# Patient Record
Sex: Female | Born: 1960 | Race: Black or African American | Hispanic: No | Marital: Single | Smoking: Never smoker
Health system: Southern US, Community
[De-identification: ages and names within clinical notes are randomized; demographics above are authoritative.]

## PROBLEM LIST (undated history)

## (undated) ENCOUNTER — Ambulatory Visit: Source: Home / Self Care

---

## 1999-03-17 ENCOUNTER — Other Ambulatory Visit: Admission: RE | Admit: 1999-03-17 | Discharge: 1999-03-17 | Payer: Self-pay | Admitting: *Deleted

## 2000-05-18 ENCOUNTER — Other Ambulatory Visit: Admission: RE | Admit: 2000-05-18 | Discharge: 2000-05-18 | Payer: Self-pay | Admitting: Obstetrics and Gynecology

## 2001-07-08 ENCOUNTER — Other Ambulatory Visit: Admission: RE | Admit: 2001-07-08 | Discharge: 2001-07-08 | Payer: Self-pay | Admitting: Obstetrics and Gynecology

## 2001-07-19 ENCOUNTER — Ambulatory Visit (HOSPITAL_COMMUNITY): Admission: RE | Admit: 2001-07-19 | Discharge: 2001-07-19 | Payer: Self-pay | Admitting: Obstetrics and Gynecology

## 2001-07-19 ENCOUNTER — Encounter: Payer: Self-pay | Admitting: Obstetrics and Gynecology

## 2002-05-29 ENCOUNTER — Other Ambulatory Visit: Admission: RE | Admit: 2002-05-29 | Discharge: 2002-05-29 | Payer: Self-pay | Admitting: Obstetrics and Gynecology

## 2003-08-10 ENCOUNTER — Ambulatory Visit (HOSPITAL_COMMUNITY): Admission: RE | Admit: 2003-08-10 | Discharge: 2003-08-10 | Payer: Self-pay | Admitting: Obstetrics and Gynecology

## 2003-08-19 ENCOUNTER — Other Ambulatory Visit: Admission: RE | Admit: 2003-08-19 | Discharge: 2003-08-19 | Payer: Self-pay | Admitting: Obstetrics and Gynecology

## 2004-05-09 ENCOUNTER — Other Ambulatory Visit: Admission: RE | Admit: 2004-05-09 | Discharge: 2004-05-09 | Payer: Self-pay | Admitting: Obstetrics and Gynecology

## 2005-02-13 ENCOUNTER — Ambulatory Visit: Payer: Self-pay | Admitting: Obstetrics and Gynecology

## 2005-09-04 ENCOUNTER — Other Ambulatory Visit: Admission: RE | Admit: 2005-09-04 | Discharge: 2005-09-04 | Payer: Self-pay | Admitting: Obstetrics and Gynecology

## 2006-08-21 ENCOUNTER — Emergency Department (HOSPITAL_COMMUNITY): Admission: EM | Admit: 2006-08-21 | Discharge: 2006-08-21 | Payer: Self-pay | Admitting: Emergency Medicine

## 2018-01-07 ENCOUNTER — Other Ambulatory Visit: Payer: Self-pay | Admitting: Obstetrics and Gynecology

## 2018-01-07 ENCOUNTER — Other Ambulatory Visit (HOSPITAL_COMMUNITY)
Admission: RE | Admit: 2018-01-07 | Discharge: 2018-01-07 | Disposition: A | Discharge status: Home / Self Care | Payer: BLUE CROSS/BLUE SHIELD | Source: Ambulatory Visit | Attending: Obstetrics and Gynecology | Admitting: Obstetrics and Gynecology

## 2018-01-07 DIAGNOSIS — Z01411 Encounter for gynecological examination (general) (routine) with abnormal findings: Secondary | ICD-10-CM | POA: Diagnosis present

## 2018-01-08 LAB — CYTOLOGY - PAP
Diagnosis: NEGATIVE
HPV: NOT DETECTED

## 2018-01-09 ENCOUNTER — Other Ambulatory Visit: Payer: Self-pay | Admitting: Internal Medicine

## 2018-01-09 DIAGNOSIS — E2839 Other primary ovarian failure: Secondary | ICD-10-CM

## 2018-01-09 DIAGNOSIS — Z1231 Encounter for screening mammogram for malignant neoplasm of breast: Secondary | ICD-10-CM

## 2018-03-25 ENCOUNTER — Ambulatory Visit
Admission: RE | Admit: 2018-03-25 | Discharge: 2018-03-25 | Disposition: A | Discharge status: Home / Self Care | Payer: BLUE CROSS/BLUE SHIELD | Source: Ambulatory Visit | Attending: Internal Medicine | Admitting: Internal Medicine

## 2018-03-25 DIAGNOSIS — E2839 Other primary ovarian failure: Secondary | ICD-10-CM

## 2018-03-25 DIAGNOSIS — Z1231 Encounter for screening mammogram for malignant neoplasm of breast: Secondary | ICD-10-CM

## 2019-06-23 ENCOUNTER — Ambulatory Visit: Payer: BC Managed Care – PPO | Attending: Family

## 2019-06-23 DIAGNOSIS — Z23 Encounter for immunization: Secondary | ICD-10-CM | POA: Insufficient documentation

## 2019-06-23 NOTE — Progress Notes (Addendum)
   Covid-19 Vaccination Clinic  Name:  Alexandra Alvarez    MRN: [id number] DOB: 1960-05-27  06/23/2019  [name] was observed post Covid-19 immunization for 30 minutes based on pre-vaccination screening without incidence. She was provided with Vaccine Information Sheet and instruction to access the V-Safe system.   [name] was instructed to call 911 with any severe reactions post vaccine: Marland Kitchen Difficulty breathing  . Swelling of your face and throat  . A fast heartbeat  . A bad rash all over your body  . Dizziness and weakness    Immunizations Administered    Name Date Dose VIS Date Route   Moderna COVID-19 Vaccine 06/23/2019  1:14 PM 0.5 mL 04/01/2019 Intramuscular   Manufacturer: Gala Murdoch   Lot: [id number]   NDC: 39179-217-83    Patient noted to have hives on bilateral cheeks non itchy, no respiratory distress or other side effects. bp 135/89 pulse 81 sats 97%, 25mg  of benadryl and 10mg  of zyrtec given. Patient observed for additional 15 minutes.

## 2019-07-22 ENCOUNTER — Ambulatory Visit: Payer: BC Managed Care – PPO | Attending: Family

## 2019-07-22 DIAGNOSIS — Z23 Encounter for immunization: Secondary | ICD-10-CM

## 2019-07-22 NOTE — Progress Notes (Signed)
   Covid-19 Vaccination Clinic  Name:  Alexandra Alvarez    MRN: [id number] DOB: 1960-05-05  07/22/2019  [name] was observed post Covid-19 immunization for 30 minutes based on pre-vaccination screening without incident. She was provided with Vaccine Information Sheet and instruction to access the V-Safe system.   [name] was instructed to call 911 with any severe reactions post vaccine: Marland Kitchen Difficulty breathing  . Swelling of face and throat  . A fast heartbeat  . A bad rash all over body  . Dizziness and weakness   Immunizations Administered    Name Date Dose VIS Date Route   Moderna COVID-19 Vaccine 07/22/2019  9:39 AM 0.5 mL 04/01/2019 Intramuscular   Manufacturer: Moderna   Lot: [id number]   NDC: 58307-460-02

## 2019-08-05 ENCOUNTER — Ambulatory Visit: Payer: BC Managed Care – PPO

## 2020-04-16 ENCOUNTER — Other Ambulatory Visit: Payer: Self-pay | Admitting: Internal Medicine

## 2020-04-16 DIAGNOSIS — Z1231 Encounter for screening mammogram for malignant neoplasm of breast: Secondary | ICD-10-CM

## 2020-05-28 ENCOUNTER — Ambulatory Visit: Payer: BC Managed Care – PPO

## 2020-07-08 ENCOUNTER — Inpatient Hospital Stay: Admission: RE | Admit: 2020-07-08 | Payer: Self-pay | Source: Ambulatory Visit

## 2021-02-17 ENCOUNTER — Other Ambulatory Visit: Payer: Self-pay | Admitting: Internal Medicine

## 2021-02-17 DIAGNOSIS — Z1231 Encounter for screening mammogram for malignant neoplasm of breast: Secondary | ICD-10-CM

## 2021-02-22 ENCOUNTER — Other Ambulatory Visit: Payer: Self-pay | Admitting: Internal Medicine

## 2021-02-22 DIAGNOSIS — M8588 Other specified disorders of bone density and structure, other site: Secondary | ICD-10-CM

## 2021-04-01 ENCOUNTER — Ambulatory Visit
Admission: RE | Admit: 2021-04-01 | Discharge: 2021-04-01 | Disposition: A | Discharge status: Home / Self Care | Payer: Commercial Managed Care - PPO | Source: Ambulatory Visit | Attending: Internal Medicine | Admitting: Internal Medicine

## 2021-04-01 DIAGNOSIS — Z1231 Encounter for screening mammogram for malignant neoplasm of breast: Secondary | ICD-10-CM

## 2021-04-20 ENCOUNTER — Ambulatory Visit: Payer: Self-pay

## 2021-05-31 ENCOUNTER — Ambulatory Visit
Admission: RE | Admit: 2021-05-31 | Discharge: 2021-05-31 | Disposition: A | Discharge status: Home / Self Care | Payer: BC Managed Care – PPO | Source: Ambulatory Visit | Attending: Internal Medicine | Admitting: Internal Medicine

## 2021-05-31 ENCOUNTER — Other Ambulatory Visit: Payer: Self-pay | Admitting: Internal Medicine

## 2021-05-31 DIAGNOSIS — R059 Cough, unspecified: Secondary | ICD-10-CM | POA: Diagnosis not present

## 2021-05-31 DIAGNOSIS — J45991 Cough variant asthma: Secondary | ICD-10-CM | POA: Diagnosis not present

## 2021-05-31 DIAGNOSIS — R053 Chronic cough: Secondary | ICD-10-CM | POA: Diagnosis not present

## 2021-06-02 ENCOUNTER — Other Ambulatory Visit: Payer: Self-pay | Admitting: Internal Medicine

## 2021-06-02 DIAGNOSIS — R053 Chronic cough: Secondary | ICD-10-CM

## 2021-06-03 ENCOUNTER — Ambulatory Visit
Admission: RE | Admit: 2021-06-03 | Discharge: 2021-06-03 | Disposition: A | Discharge status: Home / Self Care | Payer: BC Managed Care – PPO | Source: Ambulatory Visit | Attending: Internal Medicine | Admitting: Internal Medicine

## 2021-06-03 DIAGNOSIS — I700 Atherosclerosis of aorta: Secondary | ICD-10-CM | POA: Diagnosis not present

## 2021-06-03 DIAGNOSIS — R053 Chronic cough: Secondary | ICD-10-CM

## 2021-06-10 ENCOUNTER — Encounter: Payer: Self-pay | Admitting: Student

## 2021-07-18 DIAGNOSIS — Z23 Encounter for immunization: Secondary | ICD-10-CM | POA: Diagnosis not present

## 2021-07-21 NOTE — Progress Notes (Signed)
? ?Synopsis: Referred for cough by Leeroy Cha,* ? ?Subjective:  ? ?PATIENT ID: Alexandra Alvarez GENDER: female DOB: 12-23-1960, MRN: [id number] ? ?Chief Complaint  ?Patient presents with  ? Pulmonary Consult  ?  Referred by Dr. Fatima Blank for eval of persistent cough.  Pt c/o cough off and on for approx 7 months. Her cough is non prod. She has some PND. She has noticed that gluten containing foods cause her to cough and since cutting these out this has helped.   ? ?60yF with history of covid-19 infection last year - 2021. Very high fever but was not hospitalized. She has been vaccinated/boosted for covid.  ? ?She thinks her cough is caused by allergy. She has had a cough on and off over the last 6-7 months. She has tried to cut out certain foods from her diet. Wonders if gluten free diet has improved both dyspepsia and cough. Wonders if she is allergic to yeast. Cough is typically dry. Does have postnasal drip. It's inconsistent though. If persistent then she'll take claritin which will knock it out. She has no overt reflux/heartburn. No history of asthma as a kid. No accompanying throat tightness/hoarseness. Every once in a while she'll feel a spasm somewhere between chest and throat. No trouble with choking on food/water. ? ?Heading to allergy later today. ? ? ?Otherwise pertinent review of systems is negative. ? ?No family history of lung disease ? ?Never smoker, no vaping, MJ. 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 Has HEPA filters in the house. Has lived in [place] in past.  ? ?No past medical history on file.  ? ?Family History  ?Problem Relation Age of Onset  ? Heart disease Mother   ? Multiple myeloma Father   ? Heart disease Father   ? Breast cancer Neg Hx   ?  ? ?No past surgical history on file. ? ?Social History  ? ?Socioeconomic History  ? Marital status: Single  ?  Spouse name: Not on file  ? Number of children: Not on file  ? Years of education: Not on file  ? Highest  education level: Not on file  ?Occupational History  ? Not on file  ?Tobacco Use  ? Smoking status: Never  ? Smokeless tobacco: Never  ?Vaping Use  ? Vaping Use: Never used  ?Substance and Sexual Activity  ? Alcohol use: Not on file  ? Drug use: Not on file  ? Sexual activity: Not on file  ?Other Topics Concern  ? Not on file  ?Social History Narrative  ? Not on file  ? ?Social Determinants of Health  ? ?Financial Resource Strain: Not on file  ?Food Insecurity: Not on file  ?Transportation Needs: Not on file  ?Physical Activity: Not on file  ?Stress: Not on file  ?Social Connections: Not on file  ?Intimate Partner Violence: Not on file  ?  ? ?Allergies  ?Allergen Reactions  ? Peanut-Containing Drug Products Itching  ? Shellfish Allergy Hives  ? Sulfa Antibiotics Hives  ? Tylenol [Acetaminophen] Hives  ? Penicillins Rash  ?  ? ?No outpatient medications prior to visit.  ? ?No facility-administered medications prior to visit.  ? ? ? ? ? ?Objective:  ? ?Physical Exam: ? ?General appearance: 61 y.o., female, NAD, conversant  ?Eyes: anicteric sclerae; PERRL, tracking appropriately ?HENT: NCAT; MMM ?Neck: Trachea midline; no lymphadenopathy, no JVD ?Lungs: CTAB, no crackles, no wheeze, with normal respiratory effort ?CV: RRR, no murmur  ?Abdomen: Soft, non-tender; non-distended, BS  present  ?Extremities: No peripheral edema, warm ?Skin: Normal turgor and texture; no rash ?Psych: Appropriate affect ?Neuro: Alert and oriented to person and place, no focal deficit  ? ? ? ?Vitals:  ? 07/22/21 0943  ?BP: 112/70  ?Pulse: 75  ?Temp: 98.3 ?F (36.8 ?C)  ?TempSrc: Oral  ?SpO2: 99%  ?Weight: 160 lb 6.4 oz (72.8 kg)  ?Height: 5' 3.5" (1.613 m)  ? ?99% on RA ?BMI Readings from Last 3 Encounters:  ?07/22/21 27.97 kg/m?  ? ?Wt Readings from Last 3 Encounters:  ?07/22/21 160 lb 6.4 oz (72.8 kg)  ? ? ? ?CBC ?No results found for: WBC, RBC, HGB, HCT, PLT, MCV, MCH, MCHC, RDW, LYMPHSABS, MONOABS, EOSABS, BASOSABS ? ? ? ?Chest Imaging: ?CT  Chest 06/03/21 reviewed by me unremarkable ? ?Pulmonary Functions Testing Results: ?   ? View : No data to display.  ?  ?  ?  ? ? ?   ?Assessment & Plan:  ? ?# Chronic cough ?Only typical modifiable trigger that we're able to identify from history seems to be postnasal drainage but it's relatively mild. GERD a possibility but she's not overtly symptomatic with heartburn at all. Lingering small airways disease from either asthma or covid possible but her initial course of covid did not lead to hospitalization. She's had some symptomatic improvement with dietary changes guided by her research. ? ?Plan: ?- PFT in 4-6 weeks on same day as follow up appointment ?- trial of daily flonase for post-nasal drainage ?- she'll follow with allergy later today ? ? ?I spent 47 minutes dedicated to the care of this patient on the date of this encounter to include pre-visit review of records, face-to-face time with the patient discussing conditions above, post visit ordering of testing, clinical documentation with the electronic health record, and communicating necessary findings to members of the patients care team.  ? ? ?Maryjane Hurter, MD ?[facility] ?07/22/2021 10:07 AM   ?

## 2021-07-22 ENCOUNTER — Other Ambulatory Visit: Payer: Self-pay

## 2021-07-22 ENCOUNTER — Encounter: Payer: Self-pay | Admitting: Allergy

## 2021-07-22 ENCOUNTER — Encounter: Payer: Self-pay | Admitting: Student

## 2021-07-22 ENCOUNTER — Ambulatory Visit: Payer: BC Managed Care – PPO | Admitting: Allergy

## 2021-07-22 ENCOUNTER — Ambulatory Visit: Payer: BC Managed Care – PPO | Admitting: Student

## 2021-07-22 VITALS — BP 112/70 | HR 75 | Temp 98.3°F | Ht 63.5 in | Wt 160.4 lb

## 2021-07-22 VITALS — BP 118/70 | HR 90 | Temp 98.1°F | Resp 20 | Ht 63.0 in | Wt 162.0 lb

## 2021-07-22 DIAGNOSIS — H1013 Acute atopic conjunctivitis, bilateral: Secondary | ICD-10-CM | POA: Diagnosis not present

## 2021-07-22 DIAGNOSIS — R053 Chronic cough: Secondary | ICD-10-CM | POA: Diagnosis not present

## 2021-07-22 DIAGNOSIS — H109 Unspecified conjunctivitis: Secondary | ICD-10-CM

## 2021-07-22 DIAGNOSIS — T781XXD Other adverse food reactions, not elsewhere classified, subsequent encounter: Secondary | ICD-10-CM | POA: Diagnosis not present

## 2021-07-22 DIAGNOSIS — T781XXA Other adverse food reactions, not elsewhere classified, initial encounter: Secondary | ICD-10-CM | POA: Diagnosis not present

## 2021-07-22 DIAGNOSIS — J310 Chronic rhinitis: Secondary | ICD-10-CM | POA: Diagnosis not present

## 2021-07-22 DIAGNOSIS — T50905D Adverse effect of unspecified drugs, medicaments and biological substances, subsequent encounter: Secondary | ICD-10-CM

## 2021-07-22 MED ORDER — FLUTICASONE PROPIONATE 50 MCG/ACT NA SUSP: 1.0000 | Freq: Every day | NASAL | 2 refills | Status: AC

## 2021-07-22 NOTE — Patient Instructions (Addendum)
-   you will be called to set up breathing tests (PFTs) ?- see you 4-6 weeks to discuss PFTs ? ?

## 2021-07-22 NOTE — Progress Notes (Signed)
? ? ?New Patient Note ? ?RE: Alexandra Alvarez MRN: [id number] DOB: April 18, 1961 ?Date of Office Visit: 07/22/2021 ? ?Primary care provider: Leeroy Cha, MD ? ?Chief Complaint: Allergies ? ?History of present illness: ?Alexandra Alvarez is a 61 y.o. female presenting today for evaluation of multiple allergies. ? ?She states she is allergic to many things.  She has never had allergy testing.  ?She states gluten is questionable for her and she states when she eats gluten products she has an "irritated gut"  with bloating, pain and feels inflamed.  ?With shellfish ingestion she develops a fine, itchy rash.  She recalls eating crab at restaurant and developed scratchy throat and fine rash on face.  ?She states peanut causes the same issue as shellfish.  ?With dairy/lactose she develops bloating and gassiness.  She pretty much avoids dairy products.  She has not tried lactose-free products. ?MSG causes her to have a dry throat and feel itchy. ?With tylenol she states she had a reaction initially with theraflu and again with plain tylenol.  She then realized that TheraFlu contains acetaminophen.  She developed hives with use of acetaminophen treated with benadryl.  ?Sulfa antibiotic she has developed a rash.  ?She has had penicillin allergy from childhood and states around age 11 she calls developing a rash over the use of a penicillin-based antibiotic.   ? ?With pollen exposure she reports having coughing.  She reports a little bit of water eyes as well.  She also does report a post-nasal drip and throat clearing. She will have more itchy/watery eyes mostly with grass pollen exposure.  Claritin usually will help these symptoms.  ? ?She is seeing pulmonology for chronic cough.  She has been recommended to have PFTs in about a month and recommended to do a trial of Flonase. ? ? ?Review of systems: ?Review of Systems  ?Constitutional: Negative.   ?HENT:  Positive for postnasal drip.   ?Eyes: Negative.   ?Respiratory:   Positive for cough.   ?Cardiovascular: Negative.   ?Gastrointestinal: Negative.   ?Musculoskeletal: Negative.   ?Skin: Negative.   ?Allergic/Immunologic: Negative.   ?Neurological: Negative.   ? ?All other systems negative unless noted above in HPI ? ?Past medical history: ?History reviewed. No pertinent past medical history. ? ? ?Past surgical history: ?History reviewed. No pertinent surgical history. ? ?Family history:  ?Family History  ?Problem Relation Age of Onset  ? Heart disease Mother   ? Multiple myeloma Father   ? Heart disease Father   ? Breast cancer Neg Hx   ? ? ?Social history: ?She lives in a home with carpeting with heat pump heating and cooling.  3 dogs in the home.  There is no concern for water damage, mildew or roaches in the home.  She has a Media planner.  She does not report a smoking history. ? ? ?Medication List: ?Current Outpatient Medications  ?Medication Sig Dispense Refill  ? BIOTIN PO     ? calcium carbonate (TUMS - DOSED IN MG ELEMENTAL CALCIUM) 500 MG chewable tablet Chew by mouth as needed.    ? fluticasone (FLONASE) 50 MCG/ACT nasal spray Place 1 spray into both nostrils daily. 16 g 2  ? Multiple Vitamin (MULTIVITAMIN PO)     ? ?No current facility-administered medications for this visit.  ? ? ?Known medication allergies: ?Allergies  ?Allergen Reactions  ? Gluten Meal   ?  Other reaction(s): stomach upset  ? Monosodium Glutamate   ?  Other reaction(s): itching  ?  Peanut-Containing Drug Products Itching  ? Shellfish Allergy Hives  ? Sulfa Antibiotics Hives  ? Tylenol [Acetaminophen] Hives  ? Penicillins Rash  ? ? ? ?Physical examination: ?Blood pressure 118/70, pulse 90, temperature 98.1 ?F (36.7 ?C), temperature source Temporal, resp. rate 20, height $RemoveBe'5\' 3"'KIoqJYCox$  (1.6 m), weight 162 lb (73.5 kg), SpO2 98 %. ? ?General: Alert, interactive, in no acute distress. ?HEENT: PERRLA, TMs pearly gray, turbinates non-edematous without discharge, post-pharynx non  erythematous. ?Neck: Supple without lymphadenopathy. ?Lungs: Clear to auscultation without wheezing, rhonchi or rales. {no increased work of breathing. ?CV: Normal S1, S2 without murmurs. ?Abdomen: Nondistended, nontender. ?Skin: Warm and dry, without lesions or rashes. ?Extremities:  No clubbing, cyanosis or edema. ?Neuro:   Grossly intact. ? ?Diagnositics/Labs: ? ?Allergy testing:  ? Airborne Adult Perc - 07/22/21 1421   ? ? Time Antigen Placed 1426   ? Allergen Manufacturer Lavella Hammock   ? Location Back   ? Number of Test 59   ? 1. Control-Buffer 50% Glycerol Negative   ? 2. Control-Histamine 1 mg/ml 2+   ? 3. Albumin saline Negative   ? 4. Sleep[place] Negative   ? 5. [place] 4+   ? 6. Johnson Negative   ? 7. Buckingham Blue Negative   ? 8. Meadow Fescue Negative   ? 9. Perennial Rye Negative   ? 10. Sweet Vernal 4+   ? 11. Timothy 2+   ? 12. Cocklebur 2+   ? 13. Burweed Marshelder Negative   ? 14. Ragweed, short Negative   ? 15. Ragweed, Giant 2+   ? 16. Plantain,  English 2+   ? 17. Lamb's Quarters Negative   ? 18. Sheep Sorrell Negative   ? 19. Rough Pigweed Negative   ? 20. Marsh Elder, Rough Negative   ? 21. Mugwort, Common Negative   ? 22. Ash mix 2+   ? 23. Wendee Copp mix Negative   ? 24. Beech American Negative   ? 25. Box, Elder Negative   ? 26. Cedar, red Negative   ? 27. Cottonwood, [place] Negative   ? 28. Elm mix Negative   ? 29. Hickory Negative   ? 30. Maple mix Negative   ? 31. Oak, [place] mix Negative   ? 32. Pecan Pollen Negative   ? 33. Pine mix Negative   ? 34. Sycamore Eastern Negative   ? 35. [place], Black Pollen 2+   ? 36. Alternaria alternata Negative   ? 75. Cladosporium Herbarum Negative   ? 38. Aspergillus mix Negative   ? 39. Penicillium mix Negative   ? 40. Bipolaris sorokiniana (Helminthosporium) Negative   ? 41. Drechslera spicifera (Curvularia) Negative   ? 42. Mucor plumbeus Negative   ? 43. Fusarium moniliforme Negative   ? 44. Aureobasidium pullulans (pullulara) Negative   ? 45. Rhizopus oryzae  Negative   ? 46. Botrytis cinera Negative   ? 47. Epicoccum nigrum Negative   ? 48. Phoma betae Negative   ? 49. Candida Albicans Negative   ? 50. Trichophyton mentagrophytes Negative   ? 51. Mite, D Farinae  5,000 AU/ml Negative   ? 52. Mite, D Pteronyssinus  5,000 AU/ml Negative   ? 53. Cat Hair 10,000 BAU/ml Negative   ? 54.  Dog Epithelia Negative   ? 55. Mixed Feathers Negative   ? 56. Horse Epithelia Negative   ? 57. Cockroach, [place] Negative   ? 58. Mouse Negative   ? 59. Tobacco Leaf Negative   ? ?  ?  ? ?  ? ?  Food Adult Perc - 07/22/21 1400   ? ? Time Antigen Placed 1426   ? Allergen Manufacturer Lavella Hammock   ? Location Back   ? Number of allergen test 19   ? 1. Peanut Negative   ? 3. Wheat Negative   ? 5. Milk, cow Negative   ? 10. Cashew Negative   ? 11. Pecan Food Negative   ? 12. Waverly Negative   ? 13. Almond Negative   ? 14. Hazelnut Negative   ? 15. [place] nut Negative   ? 16. Coconut Negative   ? 17. Pistachio Negative   ? 25. Shrimp Negative   ? 26. Crab Negative   ? 27. Lobster Negative   ? 28. Oyster Negative   ? 29. Scallops Negative   ? 30. Barley Negative   ? 31. Oat  Negative   ? 32. Rye  Negative   ? ?  ?  ? ?  ?  ?Allergy testing results were read and interpreted by provider, documented by clinical staff. ? ? ?Assessment and plan: ?Adverse food reaction ?Adverse effect of medication ?Rhinoconjunctivitis ? ?- environmental allergen testing is positive to grasses, weeds, tree pollen ?- Copy of test results provided.  ?- Avoidance measures provided. ?- Can take for allergy symptoms: Allegra (fexofenadine) 180mg  tablet OR Zyrtec (cetirizine) 10mg  tablet OR Xyzal (levocetirizine) 5mg  tablet once daily as needed.  These are long-acting antihistamines.   ?Ryaltris (olopatadine/mometasone) two sprays per nostril 1-2 times daily as needed for nasal congestion or drainage.  Sample provided ?Pataday one drop per eye twice daily as needed for itching or watering ? ?- food allergy testing is negative  to dairy, nuts, shellfish, gluten products.  ?- serum IgE levels have been ordered to ensure you do not have IgE to these foods ?- should significant symptoms recur or new symptoms occur, a journal is to be kept recording any f

## 2021-07-22 NOTE — Patient Instructions (Signed)
Adverse events to foods, medications and environmental allergen ?- environmental allergen testing is positive to grasses, weeds, tree pollen ?- Copy of test results provided.  ?- Avoidance measures provided. ?- Can take for allergy symptoms: Allegra (fexofenadine) 180mg  tablet OR Zyrtec (cetirizine) 10mg  tablet OR Xyzal (levocetirizine) 5mg  tablet once daily as needed.  These are long-acting antihistamines.   ?Ryaltris (olopatadine/mometasone) two sprays per nostril 1-2 times daily as needed for nasal congestion or drainage.  Sample provided ?Pataday one drop per eye twice daily as needed for itching or watering ? ?- food allergy testing is negative to dairy, nuts, shellfish, gluten products.  ?- serum IgE levels have been ordered to ensure you do not have IgE to these foods ?- should significant symptoms recur or new symptoms occur, a journal is to be kept recording any foods eaten, beverages consumed, medications taken, activities performed, and environmental conditions within a 6 hour time period prior to the onset of symptoms.  ? ?- we can perform graded oral challenge to Penicillin in future to see if you are not allergic any more.  This can be scheduled in the future.  You would need to hold antihistamines for 3 days prior to this visit.   ? ?- continue avoidance of sulfa antibiotics, penicillin antibiotic as well as aceteminophen ? ?- continue to avoid MSG in diet ? ?Follow-up in 6-12 months or sooner if needed ? ?

## 2021-07-24 LAB — ALLERGEN PROFILE, SHELLFISH
Clam IgE: <0.1 kU/L
F023-IgE Crab: <0.1 kU/L
F080-IgE Lobster: <0.1 kU/L
F290-IgE Oyster: <0.1 kU/L
Scallop IgE: <0.1 kU/L
Shrimp IgE: <0.1 kU/L

## 2021-07-24 LAB — IGE NUT PROF. W/COMPONENT RFLX
F017-IgE Hazelnut (Filbert): <0.1 kU/L
F018-IgE Brazil Nut: <0.1 kU/L
F020-IgE Almond: <0.1 kU/L
F202-IgE Cashew Nut: <0.1 kU/L
F203-IgE Pistachio Nut: <0.1 kU/L
F256-IgE Walnut: <0.1 kU/L
Macadamia Nut, IgE: <0.1 kU/L
Peanut, IgE: <0.1 kU/L
Pecan Nut IgE: <0.1 kU/L

## 2021-07-24 LAB — ALLERGEN, WHEAT, F4: Wheat IgE: <0.1 kU/L

## 2021-07-24 LAB — ALLERGEN BARLEY F6: Allergen Barley IgE: <0.1 kU/L

## 2021-07-24 LAB — ALLERGEN, RYE, F5: Rye IgE: <0.1 kU/L

## 2021-07-24 LAB — ALLERGEN MILK: Milk IgE: <0.1 kU/L

## 2021-07-24 LAB — ALLERGEN,OAT,F7: Allergen Oat IgE: <0.1 kU/L

## 2021-08-16 ENCOUNTER — Other Ambulatory Visit: Payer: Commercial Managed Care - PPO

## 2021-09-16 ENCOUNTER — Ambulatory Visit: Payer: BC Managed Care – PPO | Admitting: Student

## 2021-09-16 NOTE — Progress Notes (Deleted)
Synopsis: Referred for cough by Lorenda Ishihara,*  Subjective:   PATIENT ID: Alexandra Alvarez GENDER: female DOB: 31-Mar-1961, MRN: [id number]  No chief complaint on file.  61yF with history of covid-19 infection last year - 2021. Very high fever but was not hospitalized. She has been vaccinated/boosted for covid.   She thinks her cough is caused by allergy. She has had a cough on and off over the last 6-7 months. She has tried to cut out certain foods from her diet. Wonders if gluten free diet has improved both dyspepsia and cough. Wonders if she is allergic to yeast. Cough is typically dry. Does have postnasal drip. It's inconsistent though. If persistent then she'll take claritin which will knock it out. She has no overt reflux/heartburn. No history of asthma as a kid. No accompanying throat tightness/hoarseness. Every once in a while she'll feel a spasm somewhere between chest and throat. No trouble with choking on food/water.  Heading to allergy later today.   Otherwise pertinent review of systems is negative.  No family history of lung disease  Never smoker, no vaping, MJ. 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 Has HEPA filters in the house. Has lived in [place] in past.   No past medical history on file.   Family History  Problem Relation Age of Onset   Heart disease Mother    Multiple myeloma Father    Heart disease Father    Breast cancer Neg Hx      No past surgical history on file.  Social History   Socioeconomic History   Marital status: Single    Spouse name: Not on file   Number of children: Not on file   Years of education: Not on file   Highest education level: Not on file  Occupational History   Not on file  Tobacco Use   Smoking status: Never    Passive exposure: Never   Smokeless tobacco: Never  Vaping Use   Vaping Use: Never used  Substance and Sexual Activity   Alcohol use: Never   Drug use: Never   Sexual activity:  Yes  Other Topics Concern   Not on file  Social History Narrative   Not on file   Social Determinants of Health   Financial Resource Strain: Not on file  Food Insecurity: Not on file  Transportation Needs: Not on file  Physical Activity: Not on file  Stress: Not on file  Social Connections: Not on file  Intimate Partner Violence: Not on file     Allergies  Allergen Reactions   Gluten Meal     Other reaction(s): stomach upset   Monosodium Glutamate     Other reaction(s): itching   Peanut-Containing Drug Products Itching   Shellfish Allergy Hives   Sulfa Antibiotics Hives   Tylenol [Acetaminophen] Hives   Penicillins Rash     Outpatient Medications Prior to Visit  Medication Sig Dispense Refill   BIOTIN PO      calcium carbonate (TUMS - DOSED IN MG ELEMENTAL CALCIUM) 500 MG chewable tablet Chew by mouth as needed.     fluticasone (FLONASE) 50 MCG/ACT nasal spray Place 1 spray into both nostrils daily. 16 g 2   Multiple Vitamin (MULTIVITAMIN PO)      No facility-administered medications prior to visit.       Objective:   Physical Exam:  General appearance: 61 y.o., female, NAD, conversant  Eyes: anicteric sclerae; PERRL, tracking appropriately HENT: NCAT; MMM Neck: Trachea  midline; no lymphadenopathy, no JVD Lungs: CTAB, no crackles, no wheeze, with normal respiratory effort CV: RRR, no murmur  Abdomen: Soft, non-tender; non-distended, BS present  Extremities: No peripheral edema, warm Skin: Normal turgor and texture; no rash Psych: Appropriate affect Neuro: Alert and oriented to person and place, no focal deficit     There were no vitals filed for this visit.    on RA BMI Readings from Last 3 Encounters:  07/22/21 28.70 kg/m  07/22/21 27.97 kg/m   Wt Readings from Last 3 Encounters:  07/22/21 162 lb (73.5 kg)  07/22/21 160 lb 6.4 oz (72.8 kg)     CBC No results found for: WBC, RBC, HGB, HCT, PLT, MCV, MCH, MCHC, RDW, LYMPHSABS, MONOABS,  EOSABS, BASOSABS    Chest Imaging: CT Chest 06/03/21 reviewed by me unremarkable  Pulmonary Functions Testing Results:     View : No data to display.             Assessment & Plan:   # Chronic cough Only typical modifiable trigger that we're able to identify from history seems to be postnasal drainage but it's relatively mild. GERD a possibility but she's not overtly symptomatic with heartburn at all. Lingering small airways disease from either asthma or covid possible but her initial course of covid did not lead to hospitalization. She's had some symptomatic improvement with dietary changes guided by her research.  Plan: - PFT in 4-6 weeks on same day as follow up appointment - trial of daily flonase for post-nasal drainage - she'll follow with allergy later today   I spent 47 minutes dedicated to the care of this patient on the date of this encounter to include pre-visit review of records, face-to-face time with the patient discussing conditions above, post visit ordering of testing, clinical documentation with the electronic health record, and communicating necessary findings to members of the patients care team.    Maryjane Hurter, MD [facility] 09/16/2021 1:34 PM

## 2021-11-28 DIAGNOSIS — R350 Frequency of micturition: Secondary | ICD-10-CM | POA: Diagnosis not present

## 2021-11-28 DIAGNOSIS — N390 Urinary tract infection, site not specified: Secondary | ICD-10-CM | POA: Diagnosis not present

## 2022-05-19 DIAGNOSIS — Z01419 Encounter for gynecological examination (general) (routine) without abnormal findings: Secondary | ICD-10-CM | POA: Diagnosis not present

## 2022-05-19 DIAGNOSIS — Z1151 Encounter for screening for human papillomavirus (HPV): Secondary | ICD-10-CM | POA: Diagnosis not present

## 2022-05-19 DIAGNOSIS — Z124 Encounter for screening for malignant neoplasm of cervix: Secondary | ICD-10-CM | POA: Diagnosis not present

## 2023-06-22 IMAGING — MG MM DIGITAL SCREENING BILAT W/ TOMO AND CAD
8 series · 8 of 24 positions shown · non-contrast
Comparison: Previous exam(s).

CLINICAL DATA: Screening.

EXAM:
DIGITAL SCREENING BILATERAL MAMMOGRAM WITH TOMOSYNTHESIS AND CAD
TECHNIQUE: Bilateral screening digital craniocaudal and mediolateral oblique
mammograms were obtained. Bilateral screening digital breast
tomosynthesis was performed. The images were evaluated with
computer-aided detection.

[L MLO synth-2D]
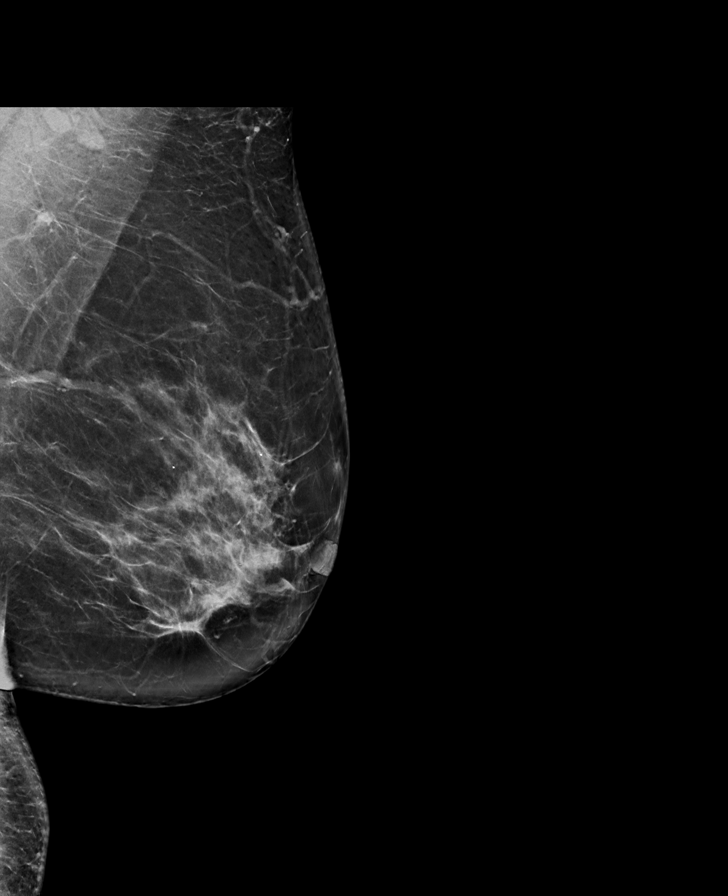

[L CC synth-2D]
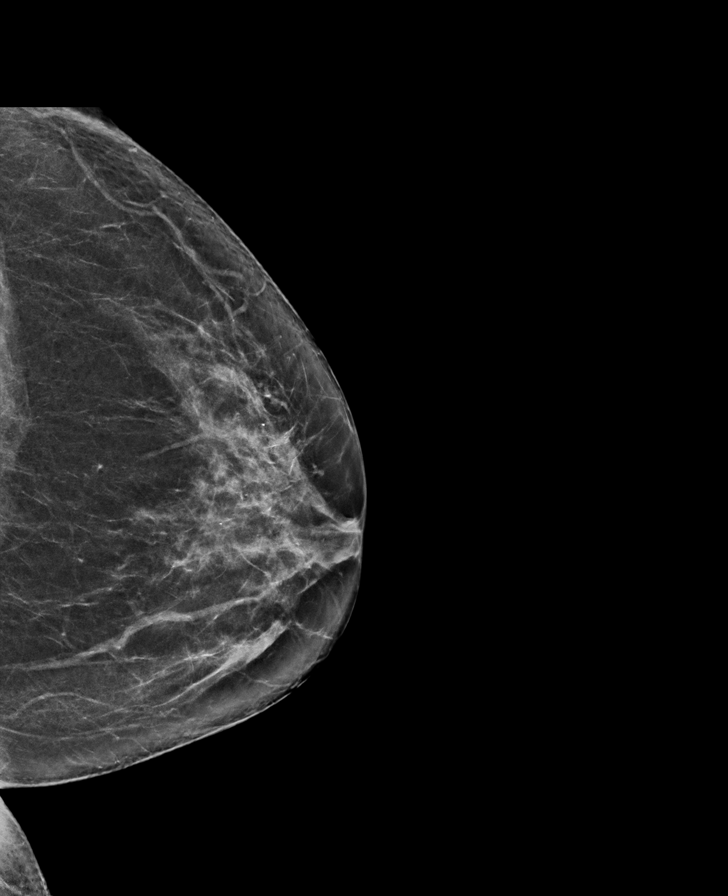

[R MLO synth-2D]
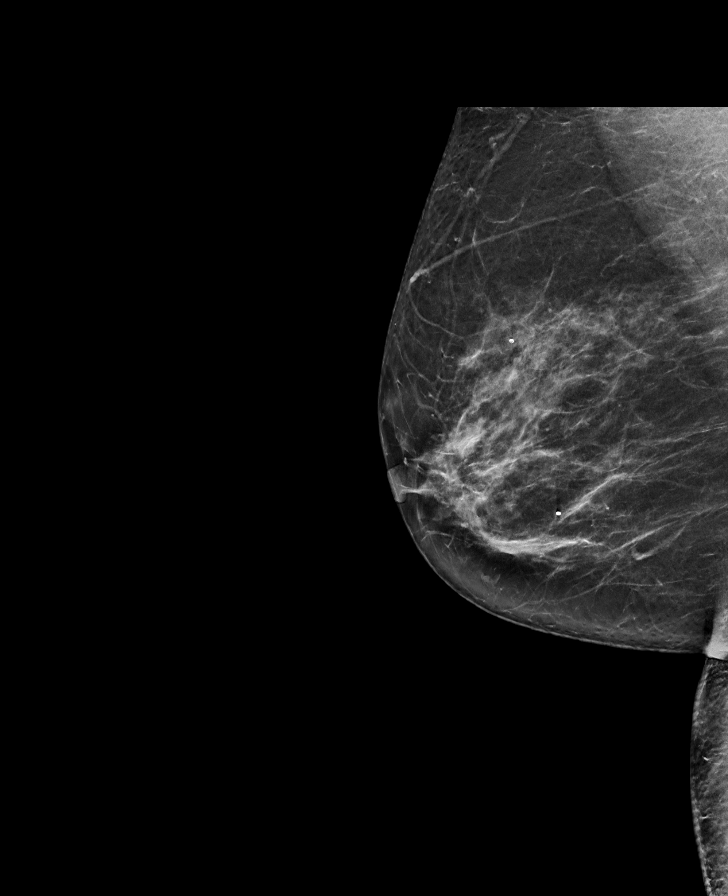

[R CC synth-2D]
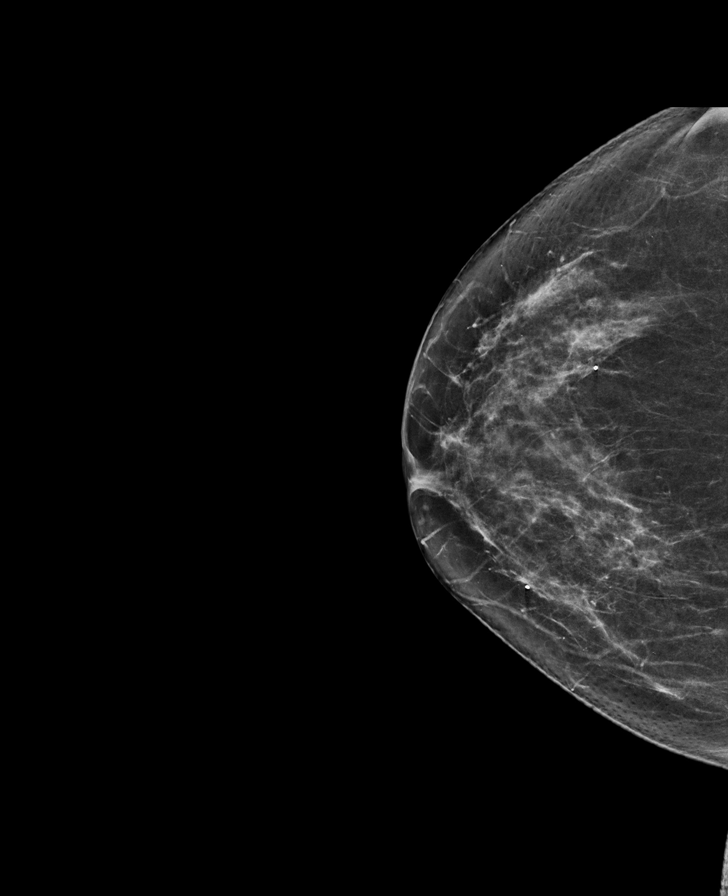

[R CC tomo · tomo slice 36/71.0]
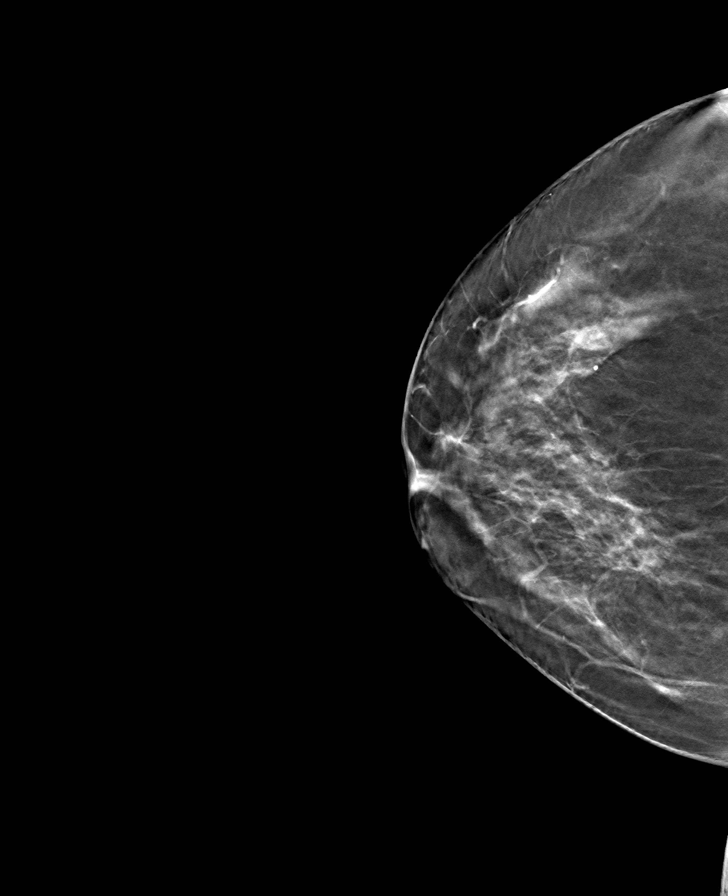

[L MLO tomo · tomo slice 42/83.0]
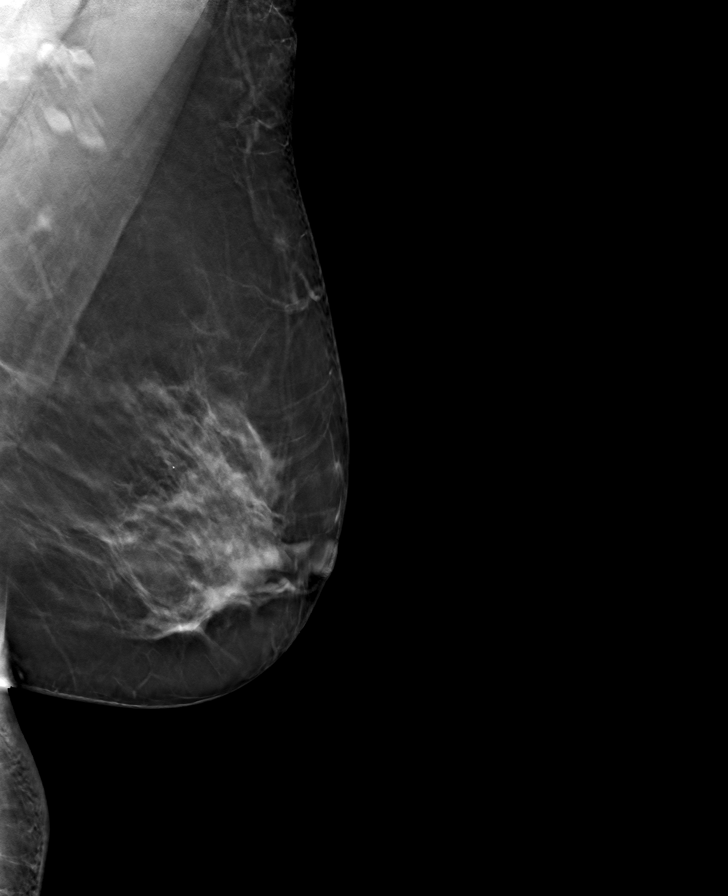

[R MLO tomo · tomo slice 37/73.0]
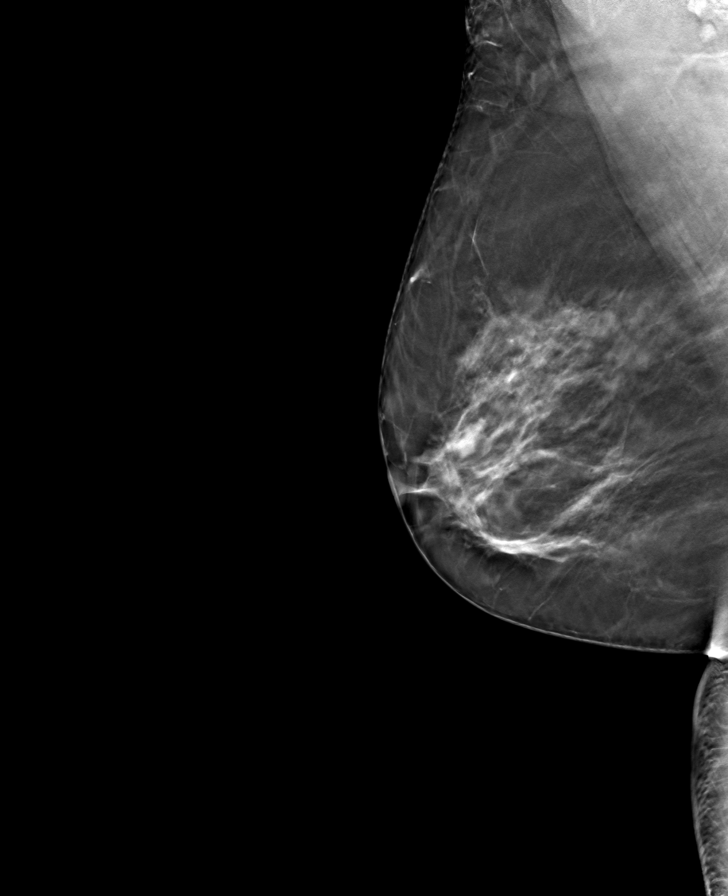

[L CC tomo · tomo slice 37/72.0]
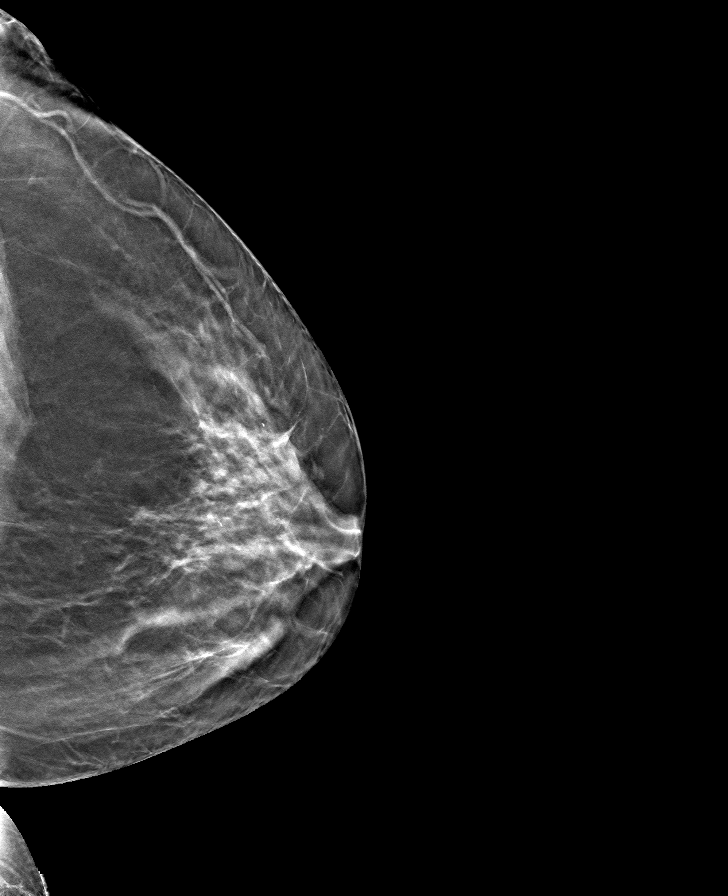

[8 of 24 positions shown; findings below may reference images not displayed]

ACR Breast Density Category c: The breast tissue is heterogeneously
dense, which may obscure small masses.
FINDINGS: There are no findings suspicious for malignancy.
IMPRESSION: No mammographic evidence of malignancy. A result letter of this
screening mammogram will be mailed directly to the patient.

RECOMMENDATION:
Screening mammogram in one year. (Code:Q3-W-BC3)

BI-RADS CATEGORY  1: Negative.

## 2023-08-24 IMAGING — CT CT CHEST W/O CM
2 of 5 series · 15 of 36 positions shown, 18 images · non-contrast
Comparison: 05/31/2021

CLINICAL DATA: Cough for months, suspected right apical opacity by
radiographs



[Series 4: chest 2.00 br40 s3 · coronal · 0.60mm/px · 3 of 133 slices shown]
[im 27/133  lung]
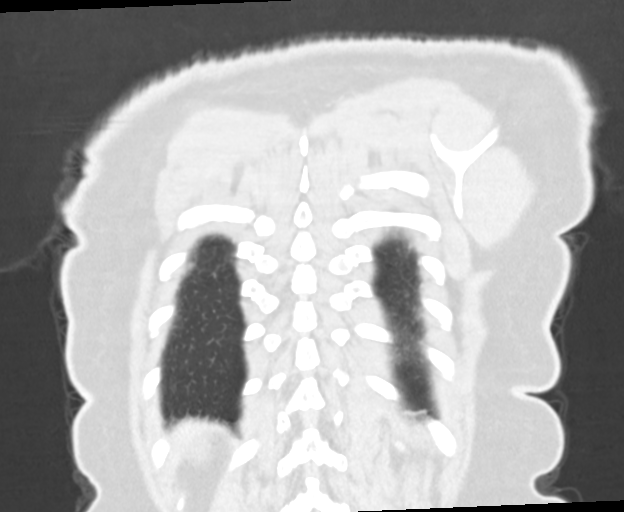
[im 53/133  lung]
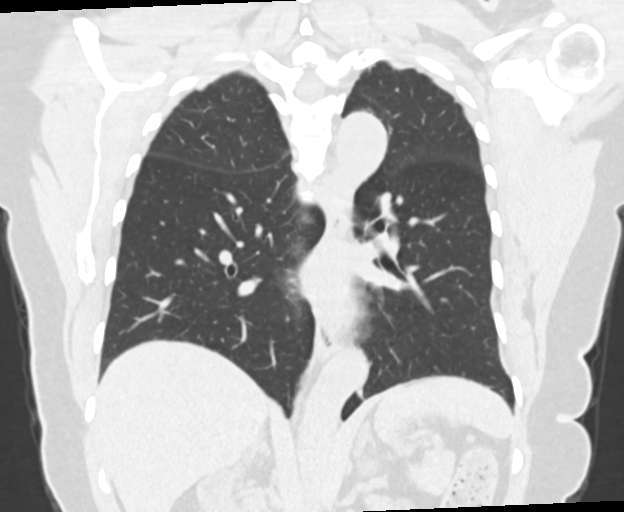
[im 80/133  lung]
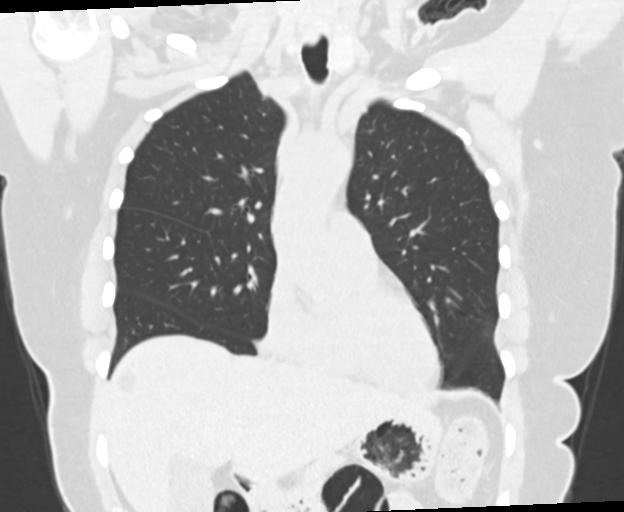

[Series 10: chest 1.00 br40 s3 super d · axial · 0.75mm/px · z∈[+1565,+1830]mm · 12 of 383 slices shown, 15 images]
[im 26/383  mediastinal]
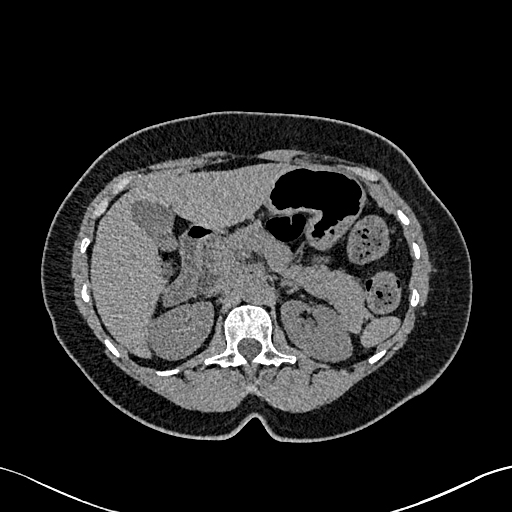
[im 26/383  lung]
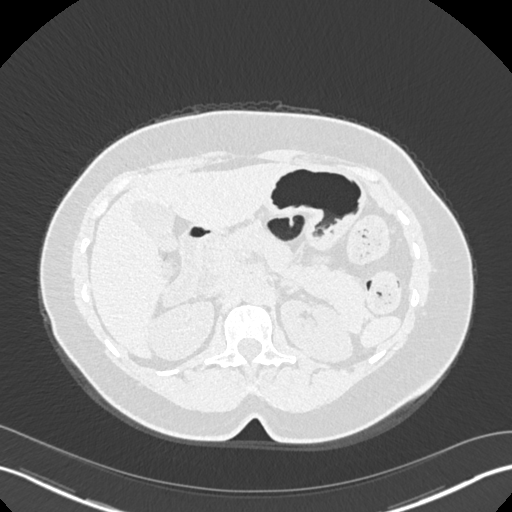
[im 51/383  lung]
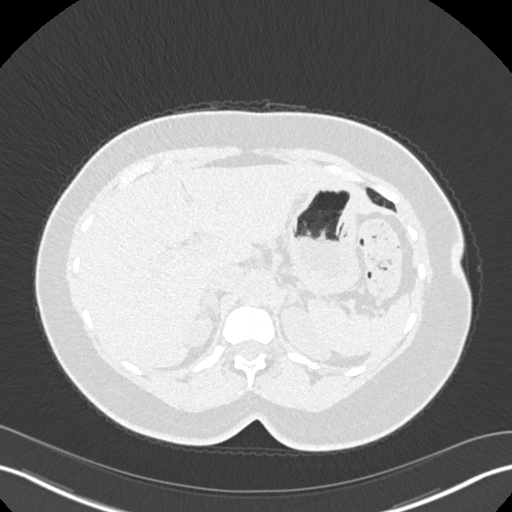
[im 77/383  lung]
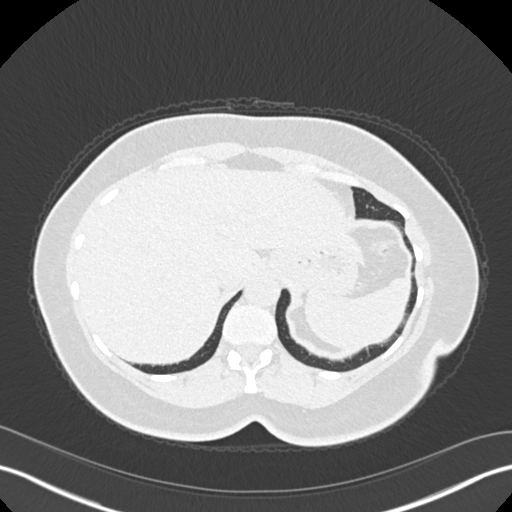
[im 128/383  lung]
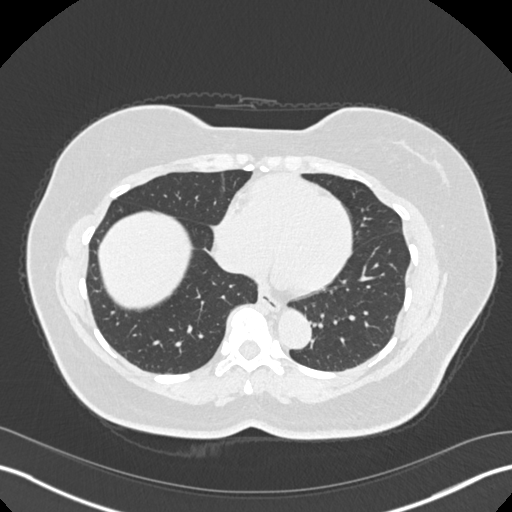
[im 153/383  mediastinal]
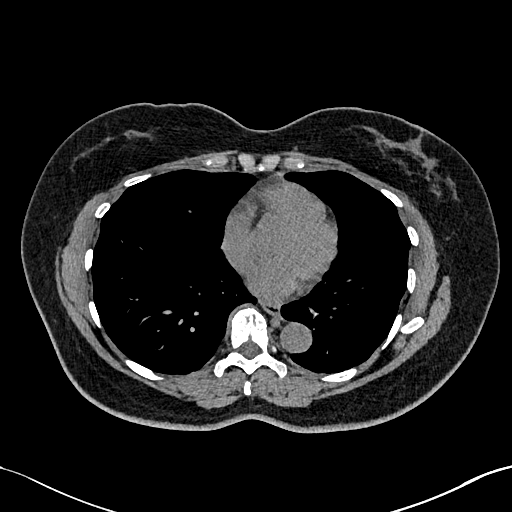
[im 153/383  lung]
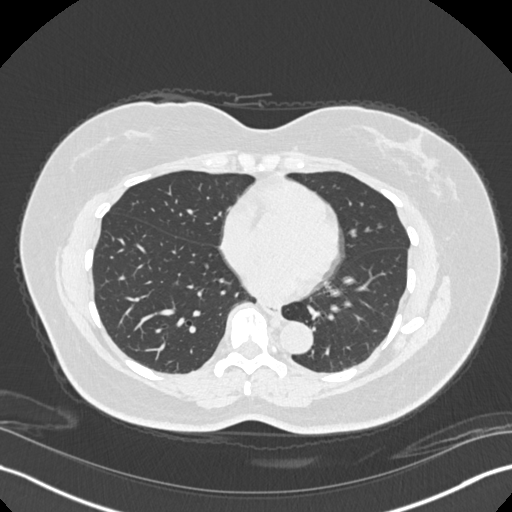
[im 179/383  lung]
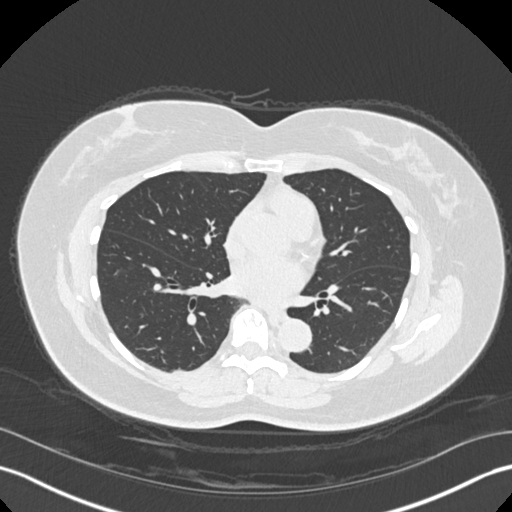
[im 204/383  lung]
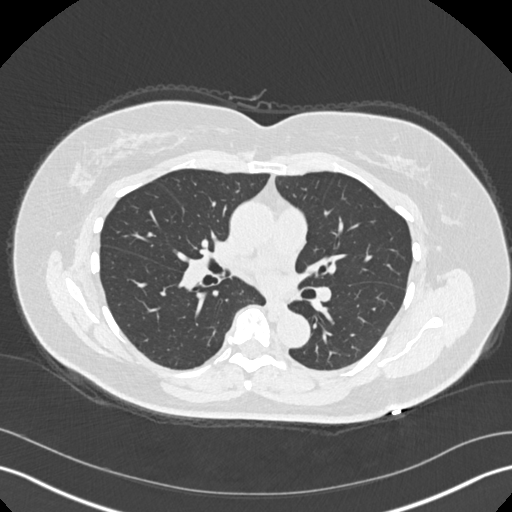
[im 230/383  lung]
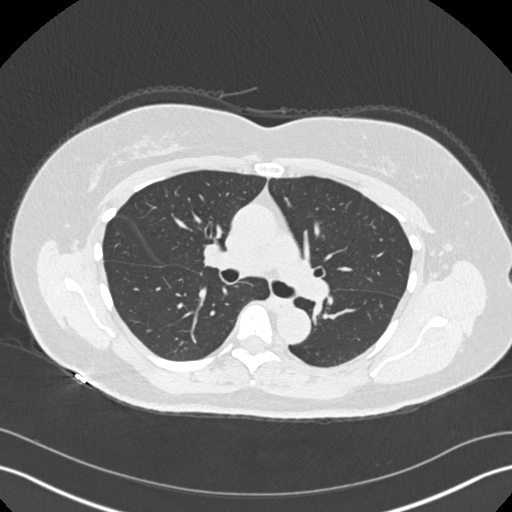
[im 255/383  mediastinal]
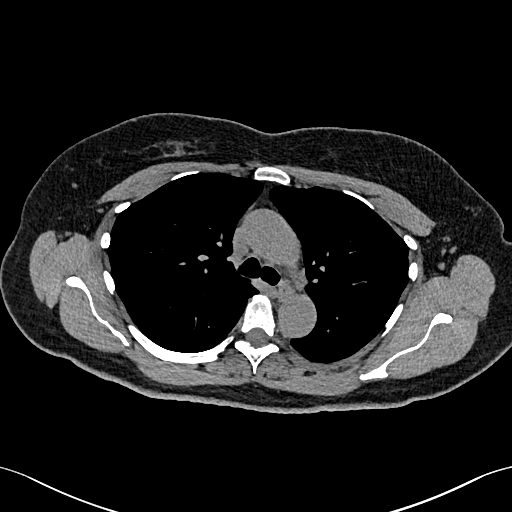
[im 255/383  lung]
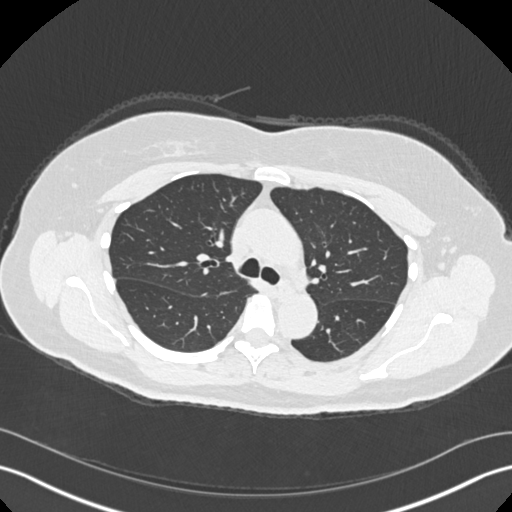
[im 306/383  lung]
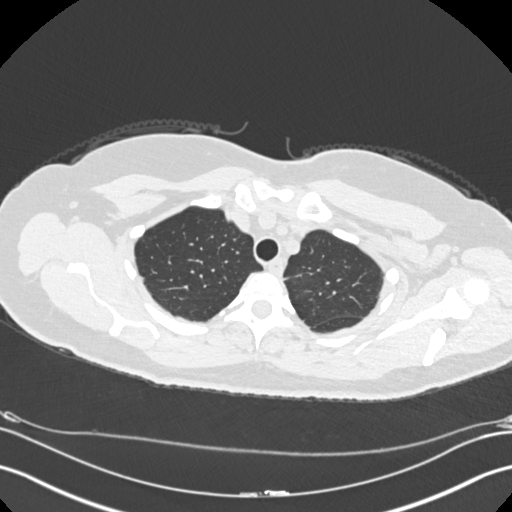
[im 332/383  lung]
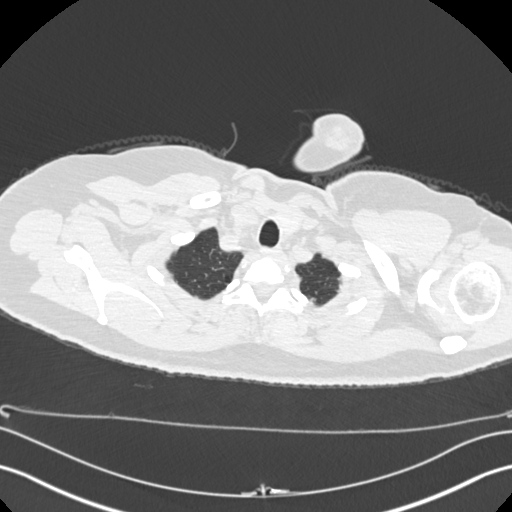
[im 357/383  lung]
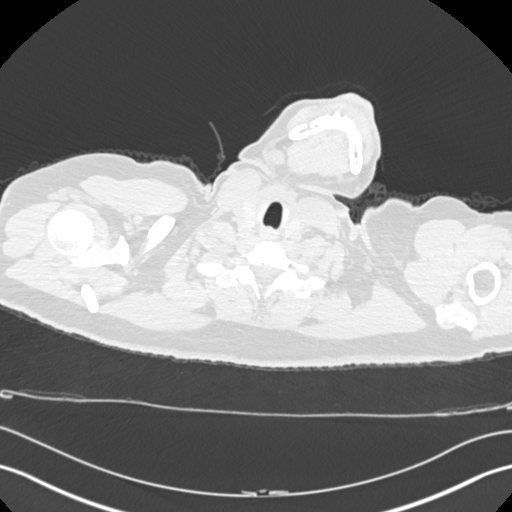

[15 of 36 positions shown; findings below may reference images not displayed]

FINDINGS: Cardiovascular: Aortic atherosclerosis. Normal heart size. Scattered
three-vessel coronary artery calcifications. No pericardial
effusion.

Mediastinum/Nodes: No enlarged mediastinal, hilar, or axillary lymph
nodes. Thyroid gland, trachea, and esophagus demonstrate no
significant findings.

Lungs/Pleura: Minimal biapical pleuroparenchymal scarring. No
pleural effusion or pneumothorax.

Upper Abdomen: No acute abnormality.

Musculoskeletal: No chest wall abnormality. No suspicious osseous
lesions identified.
IMPRESSION: 1. Minimal biapical pleuroparenchymal scarring, which likely
accounts for suspected right apical opacity on prior radiographs. No
evidence of mass or airspace disease at this time.
2. Coronary artery disease.

Aortic Atherosclerosis ([id number]).

## 2023-09-28 ENCOUNTER — Other Ambulatory Visit: Payer: Self-pay

## 2023-09-28 ENCOUNTER — Ambulatory Visit
Admission: RE | Admit: 2023-09-28 | Discharge: 2023-09-28 | Disposition: A | Discharge status: Home / Self Care | Source: Ambulatory Visit | Attending: Physician Assistant | Admitting: Physician Assistant

## 2023-09-28 VITALS — BP 124/74 | HR 68 | Temp 97.3°F | Resp 18 | Ht 64.0 in | Wt 160.0 lb

## 2023-09-28 DIAGNOSIS — J309 Allergic rhinitis, unspecified: Secondary | ICD-10-CM | POA: Insufficient documentation

## 2023-09-28 DIAGNOSIS — R0982 Postnasal drip: Secondary | ICD-10-CM | POA: Insufficient documentation

## 2023-09-28 DIAGNOSIS — J029 Acute pharyngitis, unspecified: Secondary | ICD-10-CM | POA: Diagnosis present

## 2023-09-28 LAB — POCT RAPID STREP A (OFFICE): Rapid Strep A Screen: NEGATIVE

## 2023-09-28 NOTE — ED Provider Notes (Signed)
 [facility]    CSN: [id number] Arrival date & time: 09/28/23  0808      History   Chief Complaint Chief Complaint  Patient presents with   Cough    HPI Alexandra Alvarez is a 62 y.o. female.   HPI  Pt reports she is having a "gritty" sensation in her throat along with postnasal drainage She reports coughing that is predominantly dry and some exudates in the back of the throat She reports previous hx of allergies  She states cough has been present for about 2 weeks  Interventions: salt water gargles, Claritin twice, ginger and lemon water   She would like a strep test as she has been around young children   History reviewed. No pertinent past medical history.  There are no active problems to display for this patient.   History reviewed. No pertinent surgical history.  OB History   No obstetric history on file.      Home Medications    Prior to Admission medications   Medication Sig Start Date End Date Taking? Authorizing Provider  BIOTIN PO     [provider]  calcium carbonate (TUMS - DOSED IN MG ELEMENTAL CALCIUM) 500 MG chewable tablet Chew by mouth as needed.    [provider]  fluticasone  (FLONASE ) 50 MCG/ACT nasal spray Place 1 spray into both nostrils daily. 07/22/21   Gloriajean Large, MD  Multiple Vitamin (MULTIVITAMIN PO)     [provider]    Family History Family History  Problem Relation Age of Onset   Heart disease Mother    Multiple myeloma Father    Heart disease Father    Breast cancer Neg Hx     Social History Social History   Tobacco Use   Smoking status: Never    Passive exposure: Never   Smokeless tobacco: Never  Vaping Use   Vaping status: Never Used  Substance Use Topics   Alcohol use: Never   Drug use: Never     Allergies   Gluten meal, Peanut-containing drug products, Shellfish allergy, Shellfish-derived products, Sulfa antibiotics, Tylenol [acetaminophen], Monosodium  glutamate, and Penicillins   Review of Systems Review of Systems  Constitutional:  Negative for chills, fatigue and fever.  HENT:  Positive for postnasal drip, rhinorrhea and sore throat. Negative for ear pain.   Respiratory:  Positive for cough and chest tightness. Negative for shortness of breath and wheezing.   Gastrointestinal:  Negative for diarrhea, nausea and vomiting.  Musculoskeletal:  Negative for myalgias.  Skin:  Negative for rash.     Physical Exam Triage Vital Signs ED Triage Vitals  Encounter Vitals Group     BP 09/28/23 0828 124/74     Systolic BP Percentile --      Diastolic BP Percentile --      Pulse Rate 09/28/23 0828 68     Resp 09/28/23 0828 18     Temp 09/28/23 0828 (!) 97.3 F (36.3 C)     Temp Source 09/28/23 0828 Temporal     SpO2 09/28/23 0828 97 %     Weight 09/28/23 0827 160 lb (72.6 kg)     Height 09/28/23 0827 5\' 4"  (1.626 m)     Head Circumference --      Peak Flow --      Pain Score 09/28/23 0826 2     Pain Loc --      Pain Education --      Exclude from Growth Chart --  No data found.  Updated Vital Signs BP 124/74 (BP Location: Right Arm)   Pulse 68   Temp (!) 97.3 F (36.3 C) (Temporal)   Resp 18   Ht 5\' 4"  (1.626 m)   Wt 160 lb (72.6 kg)   SpO2 97%   BMI 27.46 kg/m   Visual Acuity Right Eye Distance:   Left Eye Distance:   Bilateral Distance:    Right Eye Near:   Left Eye Near:    Bilateral Near:     Physical Exam Vitals reviewed.  Constitutional:      General: She is awake. She is not in acute distress.    Appearance: Normal appearance. She is well-developed and well-groomed. She is not ill-appearing or toxic-appearing.  HENT:     Head: Normocephalic and atraumatic.     Right Ear: Hearing, tympanic membrane and ear canal normal.     Left Ear: Hearing, tympanic membrane and ear canal normal.     Mouth/Throat:     Lips: Pink.     Mouth: Mucous membranes are moist.     Palate: Mass present.     Pharynx:  Oropharynx is clear. Uvula midline. Posterior oropharyngeal erythema and postnasal drip present. No pharyngeal swelling, oropharyngeal exudate or uvula swelling.     Tonsils: No tonsillar exudate or tonsillar abscesses. 1+ on the right. 1+ on the left.  Cardiovascular:     Rate and Rhythm: Normal rate and regular rhythm.     Pulses: Normal pulses.          Radial pulses are 2+ on the right side and 2+ on the left side.     Heart sounds: Normal heart sounds. No murmur heard.    No friction rub. No gallop.  Pulmonary:     Effort: Pulmonary effort is normal.     Breath sounds: Normal breath sounds. No decreased air movement. No decreased breath sounds, wheezing, rhonchi or rales.  Musculoskeletal:     Cervical back: Normal range of motion and neck supple.  Lymphadenopathy:     Head:     Right side of head: No submental, submandibular or preauricular adenopathy.     Left side of head: No submental, submandibular or preauricular adenopathy.     Cervical:     Right cervical: No superficial cervical adenopathy.    Left cervical: No superficial cervical adenopathy.     Upper Body:     Right upper body: No supraclavicular adenopathy.     Left upper body: No supraclavicular adenopathy.  Neurological:     Mental Status: She is alert.  Psychiatric:        Behavior: Behavior is cooperative.      UC Treatments / Results  Labs (all labs ordered are listed, but only abnormal results are displayed) Labs Reviewed  POCT RAPID STREP A (OFFICE) - Normal  CULTURE, GROUP A STREP [facility])    EKG   Radiology No results found.  Procedures Procedures (including critical care time)  Medications Ordered in UC Medications - No data to display  Initial Impression / Assessment and Plan / UC Course  I have reviewed the triage vital signs and the nursing notes.  Pertinent labs & imaging results that were available during my care of the patient were reviewed by me and considered in my medical  decision making (see chart for details).      Final Clinical Impressions(s) / UC Diagnoses   Final diagnoses:  Sore throat  Allergic rhinitis, unspecified seasonality, unspecified trigger  Post-nasal drainage   Patient presents today with concerns of sore throat, postnasal drainage, runny nose that has been ongoing for over a week.  She also reports mild, dry cough.  Rapid strep negative.  Given symptoms have been persisting for longer than 72 hours no flu testing today.  She is outside the window for COVID antiviral so we will also not test for COVID today.  Patient does report concerns for exudates in the back of the throat so we will send strep culture off for definitive rule out.  Results to dictate further management.  At this time I suspect her symptoms are likely secondary to allergic rhinitis and postnasal drainage.  Recommend taking second-generation antihistamine of preference daily and adding Flonase  to assist with symptoms.  If symptoms are not improving with these interventions recommend follow-up here or with primary care provider for further evaluation and ongoing management.  Follow-up as needed    Discharge Instructions      Your rapid strep test was negative.  Since you report you are having exudates I have sent a strep culture off for definitive rule out.  If any medications are indicated by this test results they will be sent to the pharmacy that we have listed on file. At this time I suspect your symptoms are likely secondary to your allergies I also recommend adding an antihistamine to your daily regimen This includes medications like Claritin, Allegra, Zyrtec- the generics of these work very well and are usually less expensive I recommend using Flonase  nasal spray - 2 puffs twice per day to help with your nasal congestion The antihistamines and Flonase  can take a few weeks to provide significant relief from allergy symptoms but should start to provide some benefit  soon. If you feel like your symptoms are getting worse or do not seem to be improving over the next week I recommend returning here or following up with your PCP for further management and evaluation.   ED Prescriptions   None    PDMP not reviewed this encounter.   Jerona Mooring, PA-C 09/28/23 [id number]

## 2023-09-28 NOTE — Discharge Instructions (Addendum)
 Your rapid strep test was negative.  Since you report you are having exudates I have sent a strep culture off for definitive rule out.  If any medications are indicated by this test results they will be sent to the pharmacy that we have listed on file. At this time I suspect your symptoms are likely secondary to your allergies I also recommend adding an antihistamine to your daily regimen This includes medications like Claritin, Allegra, Zyrtec- the generics of these work very well and are usually less expensive I recommend using Flonase  nasal spray - 2 puffs twice per day to help with your nasal congestion The antihistamines and Flonase  can take a few weeks to provide significant relief from allergy symptoms but should start to provide some benefit soon. If you feel like your symptoms are getting worse or do not seem to be improving over the next week I recommend returning here or following up with your PCP for further management and evaluation.

## 2023-09-28 NOTE — ED Triage Notes (Addendum)
 Pt presents with complaints of productive cough x 1 week. Pt is also complaining of post nasal drip and "hard palate pain after burning with hot soup." Pt rates her overall pain a 2/10, states her tonsils feel swollen. Denies taking any OTC medications for symptoms reported PTA. Denies fevers at home.

## 2023-10-01 ENCOUNTER — Ambulatory Visit (HOSPITAL_COMMUNITY): Payer: Self-pay

## 2023-10-01 LAB — CULTURE, GROUP A STREP (THRC)
